# Patient Record
Sex: Male | Born: 1995 | Race: White | Hispanic: No | Marital: Married
Health system: Southern US, Community
[De-identification: ages and names within clinical notes are randomized; demographics above are authoritative.]

---
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
## 2016-03-28 DIAGNOSIS — M25531 Pain in right wrist: Secondary | ICD-10-CM | POA: Diagnosis not present

## 2016-03-28 DIAGNOSIS — M25532 Pain in left wrist: Secondary | ICD-10-CM | POA: Diagnosis not present

## 2018-09-13 ENCOUNTER — Other Ambulatory Visit: Payer: Self-pay

## 2018-09-13 DIAGNOSIS — Z20822 Contact with and (suspected) exposure to covid-19: Secondary | ICD-10-CM

## 2018-09-15 LAB — NOVEL CORONAVIRUS, NAA: SARS-CoV-2, NAA: NOT DETECTED

## 2018-09-17 ENCOUNTER — Telehealth: Payer: Self-pay | Admitting: General Practice

## 2018-09-17 NOTE — Telephone Encounter (Signed)
Negative COVID results given. Patient results "NOT Detected." Caller expressed understanding. ° °
